# Patient Record
Sex: Male | Born: 1955 | Race: White | Hispanic: No | Marital: Single | Smoking: Never smoker
Health system: Southern US, Community
[De-identification: ages and names within clinical notes are randomized; demographics above are authoritative.]

## PROBLEM LIST (undated history)

## (undated) HISTORY — PX: CATARACT EXTRACTION: SUR2

---

## 2015-07-20 ENCOUNTER — Encounter (HOSPITAL_BASED_OUTPATIENT_CLINIC_OR_DEPARTMENT_OTHER): Payer: Self-pay | Admitting: *Deleted

## 2015-07-20 ENCOUNTER — Emergency Department (HOSPITAL_BASED_OUTPATIENT_CLINIC_OR_DEPARTMENT_OTHER)
Admission: EM | Admit: 2015-07-20 | Discharge: 2015-07-20 | Disposition: A | Discharge status: Home / Self Care | Payer: BLUE CROSS/BLUE SHIELD | Attending: Emergency Medicine | Admitting: Emergency Medicine

## 2015-07-20 ENCOUNTER — Emergency Department (HOSPITAL_BASED_OUTPATIENT_CLINIC_OR_DEPARTMENT_OTHER): Payer: BLUE CROSS/BLUE SHIELD

## 2015-07-20 DIAGNOSIS — S80811A Abrasion, right lower leg, initial encounter: Secondary | ICD-10-CM | POA: Insufficient documentation

## 2015-07-20 DIAGNOSIS — Y92410 Unspecified street and highway as the place of occurrence of the external cause: Secondary | ICD-10-CM | POA: Insufficient documentation

## 2015-07-20 DIAGNOSIS — Y9389 Activity, other specified: Secondary | ICD-10-CM | POA: Diagnosis not present

## 2015-07-20 DIAGNOSIS — Z23 Encounter for immunization: Secondary | ICD-10-CM | POA: Diagnosis not present

## 2015-07-20 DIAGNOSIS — S50312A Abrasion of left elbow, initial encounter: Secondary | ICD-10-CM | POA: Insufficient documentation

## 2015-07-20 DIAGNOSIS — S60512A Abrasion of left hand, initial encounter: Secondary | ICD-10-CM | POA: Insufficient documentation

## 2015-07-20 DIAGNOSIS — S0101XA Laceration without foreign body of scalp, initial encounter: Secondary | ICD-10-CM

## 2015-07-20 DIAGNOSIS — Y998 Other external cause status: Secondary | ICD-10-CM | POA: Diagnosis not present

## 2015-07-20 DIAGNOSIS — S60511A Abrasion of right hand, initial encounter: Secondary | ICD-10-CM | POA: Insufficient documentation

## 2015-07-20 DIAGNOSIS — T07XXXA Unspecified multiple injuries, initial encounter: Secondary | ICD-10-CM

## 2015-07-20 DIAGNOSIS — S53402A Unspecified sprain of left elbow, initial encounter: Secondary | ICD-10-CM

## 2015-07-20 DIAGNOSIS — S0990XA Unspecified injury of head, initial encounter: Secondary | ICD-10-CM | POA: Diagnosis present

## 2015-07-20 MED ORDER — TETANUS-DIPHTH-ACELL PERTUSSIS 5-2.5-18.5 LF-MCG/0.5 IM SUSP: 0.5000 mL | Freq: Once | INTRAMUSCULAR | Status: DC

## 2015-07-20 MED ORDER — LIDOCAINE-EPINEPHRINE (PF) 2 %-1:200000 IJ SOLN
INTRAMUSCULAR | Status: AC
  Administered 2015-07-20: 21:00:00
  Filled 2015-07-20: qty 10

## 2015-07-20 NOTE — ED Notes (Signed)
Applied moist gauze to pt laceration on head which has clotted
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
## 2015-07-20 NOTE — ED Notes (Signed)
I applied bacitracin to stapled wound, procedure completed by Doctor, patient ready for discharge.

## 2015-07-20 NOTE — Discharge Instructions (Signed)
Keep wound dry and do not remove dressing for 24 hours if possible. After that, wash gently morning and night (every 12 hours) with soap and water. Use a topical antibiotic ointment and cover with a bandaid or gauze.  °  °Do NOT use rubbing alcohol or hydrogen peroxide, do not soak the area °  °Present to your primary care doctor or the urgent care of your choice, or the ED for staple removal in 10 days. °  °Every attempt was made to remove foreign body (contaminants) from the wound.  However, there is always a chance that some may remain in the wound. This can  increase your risk of infection. °  °If you see signs of infection (warmth, redness, tenderness, pus, sharp increase in pain, fever, red streaking in the skin) immediately return to the emergency department. °  °After the wound heals fully, apply sunscreen for 6-12 months to minimize scarring.  ° °
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
